# Patient Record
Sex: Male | Born: 1985 | Race: White | Hispanic: No | Marital: Single | Smoking: Former smoker
Health system: Southern US, Community
[De-identification: ages and names within clinical notes are randomized; demographics above are authoritative.]

## PROBLEM LIST (undated history)

## (undated) DIAGNOSIS — G40309 Generalized idiopathic epilepsy and epileptic syndromes, not intractable, without status epilepticus: Principal | ICD-10-CM

## (undated) DIAGNOSIS — Q282 Arteriovenous malformation of cerebral vessels: Secondary | ICD-10-CM

## (undated) HISTORY — DX: Arteriovenous malformation of cerebral vessels: Q28.2

## (undated) HISTORY — PX: MYRINGOTOMY WITH TUBE PLACEMENT: SHX5663

## (undated) HISTORY — DX: Generalized idiopathic epilepsy and epileptic syndromes, not intractable, without status epilepticus: G40.309

---
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
## 2013-05-24 ENCOUNTER — Other Ambulatory Visit (HOSPITAL_COMMUNITY): Payer: Self-pay | Admitting: Family Medicine

## 2013-05-24 ENCOUNTER — Ambulatory Visit (HOSPITAL_COMMUNITY)
Admission: RE | Admit: 2013-05-24 | Discharge: 2013-05-24 | Disposition: A | Discharge status: Home / Self Care | Payer: No Typology Code available for payment source | Source: Ambulatory Visit | Attending: Family Medicine | Admitting: Family Medicine

## 2013-05-24 DIAGNOSIS — R569 Unspecified convulsions: Secondary | ICD-10-CM

## 2013-05-24 DIAGNOSIS — R29818 Other symptoms and signs involving the nervous system: Secondary | ICD-10-CM | POA: Insufficient documentation

## 2013-05-25 ENCOUNTER — Encounter: Payer: Self-pay | Admitting: Neurology

## 2013-05-25 ENCOUNTER — Ambulatory Visit: Payer: No Typology Code available for payment source | Admitting: Diagnostic Neuroimaging

## 2013-05-25 ENCOUNTER — Ambulatory Visit (INDEPENDENT_AMBULATORY_CARE_PROVIDER_SITE_OTHER): Payer: No Typology Code available for payment source | Admitting: Neurology

## 2013-05-25 VITALS — BP 132/67 | HR 66 | Ht 71.75 in | Wt 190.0 lb

## 2013-05-25 DIAGNOSIS — Q283 Other malformations of cerebral vessels: Secondary | ICD-10-CM

## 2013-05-25 DIAGNOSIS — G40309 Generalized idiopathic epilepsy and epileptic syndromes, not intractable, without status epilepticus: Secondary | ICD-10-CM

## 2013-05-25 DIAGNOSIS — Q282 Arteriovenous malformation of cerebral vessels: Secondary | ICD-10-CM

## 2013-05-25 HISTORY — DX: Generalized idiopathic epilepsy and epileptic syndromes, not intractable, without status epilepticus: G40.309

## 2013-05-25 HISTORY — DX: Arteriovenous malformation of cerebral vessels: Q28.2

## 2013-05-25 MED ORDER — PHENYTOIN SODIUM EXTENDED 100 MG PO CAPS: 300.0000 mg | ORAL_CAPSULE | Freq: Every day | ORAL | Status: DC

## 2013-05-25 NOTE — Progress Notes (Signed)
Reason for visit: Seizures  Michael Rodriguez is a 27 y.o. male  History of present illness:  Michael Rodriguez is a 27 year old right-handed white male with a history of seizures dating back to 2012. The patient has had 4 total lifetime seizures, with the last one the day prior to this visit. The seizures comes out of sleep or just after awakening. The episodes are associated with generalized jerking and tongue biting. The patient does not lose control of the bowels or the bladder. The patient reports no focal numbness or weakness of the face, arms, or legs. The patient may have a headache following a seizure, but the patient otherwise does not have headaches. The patient denies any balance issues. 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 The patient is sent to this office for an evaluation.  Past Medical History  Diagnosis Date  . 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 AVM (arteriovenous malformation) brain 05/25/2013    Past Surgical History  Procedure Laterality Date  . Myringotomy with tube placement Bilateral     History reviewed. No pertinent family history.  Social history:  reports that he has been smoking Cigarettes.  He has been smoking about 0.00 packs per day. He does not have any smokeless tobacco history on file. He reports that  drinks alcohol. He reports that he does not use illicit drugs.  Medications:  No current outpatient prescriptions on file prior to visit.   No current facility-administered medications on file prior to visit.    Allergies: No Known Allergies  ROS:  Out of a complete 14 system review of symptoms, the patient complains only of the following symptoms, and all other reviewed systems are negative.  Snoring Memory loss, confusion, headache, seizure  Blood pressure 132/67, pulse 66, height 5' 11.75" (1.822 m), weight 190 lb (86.183  kg).  Physical Exam  General: The patient is alert and cooperative at the time of the examination.  Head: Pupils are equal, round, and reactive to light. Discs are flat bilaterally.  Neck: The neck is supple, no carotid bruits are noted.  Respiratory: The respiratory examination is clear.  Cardiovascular: The cardiovascular examination reveals a regular rate and rhythm, no obvious murmurs or rubs are noted.  Skin: Extremities are without significant edema.  Neurologic Exam  Mental status:  Cranial nerves: Facial symmetry is present. There is good sensation of the face to pinprick and soft touch bilaterally. The strength of the facial muscles and the muscles to head turning and shoulder shrug are normal bilaterally. Speech is well enunciated, no aphasia or dysarthria is noted. Extraocular movements are full. Visual fields are full.  Motor: The motor testing reveals 5 over 5 strength of all 4 extremities. Good symmetric motor tone is noted throughout.  Sensory: Sensory testing is intact to pinprick, soft touch, vibration sensation, and position sense on all 4 extremities. No evidence of extinction is noted.  Coordination: Cerebellar testing reveals good finger-nose-finger and heel-to-shin bilaterally.  Gait and station: Gait is normal. Tandem gait is normal. Romberg is negative. No drift is seen.  Reflexes: Deep tendon reflexes are symmetric and normal bilaterally. Toes are downgoing bilaterally.   Assessment/Plan:  1. Right frontal AVM  2. History seizures  The patient will be set up for MRI evaluation of the brain with and without gadolinium. I see no utility for an EEG study, as this will not  change medical management. The patient will likely require a cerebral angiogram, which will be set up after the MRI. The patient will be placed on Dilantin taking 300 mg at night. The patient will followup in 3 months. The patient is not to operate a motor vehicle for least 6  months.  Marlan Palau MD 05/25/2013 7:19 PM  [facility] [address] [place], [place] [address]  Phone [phone] Fax [phone]

## 2013-08-18 ENCOUNTER — Ambulatory Visit (INDEPENDENT_AMBULATORY_CARE_PROVIDER_SITE_OTHER): Payer: No Typology Code available for payment source

## 2013-08-18 DIAGNOSIS — G40309 Generalized idiopathic epilepsy and epileptic syndromes, not intractable, without status epilepticus: Secondary | ICD-10-CM

## 2013-08-18 DIAGNOSIS — Q282 Arteriovenous malformation of cerebral vessels: Secondary | ICD-10-CM

## 2013-08-18 DIAGNOSIS — Q283 Other malformations of cerebral vessels: Secondary | ICD-10-CM

## 2013-08-19 ENCOUNTER — Telehealth: Payer: Self-pay | Admitting: Neurology

## 2013-08-19 DIAGNOSIS — Q282 Arteriovenous malformation of cerebral vessels: Secondary | ICD-10-CM

## 2013-08-19 MED ORDER — GADOPENTETATE DIMEGLUMINE 469.01 MG/ML IV SOLN: 18.0000 mL | Freq: Once | INTRAVENOUS | Status: AC | PRN

## 2013-08-19 NOTE — Telephone Encounter (Signed)
I called the patient. The MRI the brain confirms a medium-sized arteriovenous malformation in the right frontal area. I'll get the patient set up for a cerebral angiogram at [place].

## 2013-08-25 ENCOUNTER — Telehealth: Payer: Self-pay | Admitting: Neurology

## 2013-08-26 NOTE — Telephone Encounter (Signed)
I called the patient and I talked with her mother. She had questions about the arteriovenous malformation. The patient will first go for a cerebral angiogram. He likely will require a neurosurgery evaluation, and he likely will require gamma knife treatments. I'll get the angiogram results first, and then consider a neurosurgery referral.

## 2013-08-29 ENCOUNTER — Telehealth (HOSPITAL_COMMUNITY): Payer: Self-pay | Admitting: Interventional Radiology

## 2013-08-29 NOTE — Telephone Encounter (Signed)
Called pt's mother again left VM for her to call to schedule consult JMichaux

## 2013-08-31 ENCOUNTER — Other Ambulatory Visit (HOSPITAL_COMMUNITY): Payer: Self-pay | Admitting: Chiropractic Medicine

## 2013-08-31 ENCOUNTER — Other Ambulatory Visit: Payer: Self-pay | Admitting: Neurology

## 2013-08-31 DIAGNOSIS — Q2730 Arteriovenous malformation, site unspecified: Secondary | ICD-10-CM

## 2013-09-01 ENCOUNTER — Ambulatory Visit (HOSPITAL_COMMUNITY)
Admission: RE | Admit: 2013-09-01 | Discharge: 2013-09-01 | Disposition: A | Discharge status: Home / Self Care | Payer: No Typology Code available for payment source | Source: Ambulatory Visit | Attending: Neurology | Admitting: Neurology

## 2013-09-01 ENCOUNTER — Other Ambulatory Visit (HOSPITAL_COMMUNITY): Payer: Self-pay | Admitting: Interventional Radiology

## 2013-09-01 ENCOUNTER — Encounter (HOSPITAL_COMMUNITY): Payer: Self-pay | Admitting: Pharmacy Technician

## 2013-09-01 DIAGNOSIS — Q2730 Arteriovenous malformation, site unspecified: Secondary | ICD-10-CM

## 2013-09-02 ENCOUNTER — Other Ambulatory Visit: Payer: Self-pay | Admitting: Radiology

## 2013-09-05 ENCOUNTER — Encounter (HOSPITAL_COMMUNITY): Payer: Self-pay

## 2013-09-05 ENCOUNTER — Other Ambulatory Visit (HOSPITAL_COMMUNITY): Payer: Self-pay | Admitting: Interventional Radiology

## 2013-09-05 ENCOUNTER — Ambulatory Visit (HOSPITAL_COMMUNITY)
Admission: RE | Admit: 2013-09-05 | Discharge: 2013-09-05 | Disposition: A | Discharge status: Home / Self Care | Payer: No Typology Code available for payment source | Source: Ambulatory Visit | Attending: Interventional Radiology | Admitting: Interventional Radiology

## 2013-09-05 DIAGNOSIS — G40309 Generalized idiopathic epilepsy and epileptic syndromes, not intractable, without status epilepticus: Secondary | ICD-10-CM | POA: Insufficient documentation

## 2013-09-05 DIAGNOSIS — Q283 Other malformations of cerebral vessels: Secondary | ICD-10-CM | POA: Diagnosis present

## 2013-09-05 DIAGNOSIS — Q2730 Arteriovenous malformation, site unspecified: Secondary | ICD-10-CM

## 2013-09-05 LAB — BASIC METABOLIC PANEL
BUN: 12 mg/dL (ref 6–23)
CO2: 23 mEq/L (ref 19–32)
Calcium: 9.4 mg/dL (ref 8.4–10.5)
Creatinine, Ser: 0.81 mg/dL (ref 0.50–1.35)
GFR calc Af Amer: >90 mL/min (ref >90)
GFR calc non Af Amer: >90 mL/min (ref >90)
Glucose, Bld: 97 mg/dL (ref 70–99)
Sodium: 138 mEq/L (ref 135–145)

## 2013-09-05 LAB — CBC
HCT: 42.8 % (ref 39.0–52.0)
Platelets: 245 10*3/uL (ref 150–400)
RBC: 4.63 MIL/uL (ref 4.22–5.81)
RDW: 13.3 % (ref 11.5–15.5)
WBC: 4 10*3/uL (ref 4.0–10.5)

## 2013-09-05 LAB — PROTIME-INR
INR: 0.96 (ref 0.00–1.49)
Prothrombin Time: 12.6 seconds (ref 11.6–15.2)

## 2013-09-05 LAB — APTT: aPTT: 26 seconds (ref 24–37)

## 2013-09-05 MED ORDER — MIDAZOLAM HCL 2 MG/2ML IJ SOLN
INTRAMUSCULAR | Status: AC
  Filled 2013-09-05: qty 4

## 2013-09-05 MED ORDER — FENTANYL CITRATE 0.05 MG/ML IJ SOLN
INTRAMUSCULAR | Status: AC
  Filled 2013-09-05: qty 2

## 2013-09-05 MED ORDER — IOHEXOL 300 MG/ML  SOLN
150.0000 mL | Freq: Once | INTRAMUSCULAR | Status: AC | PRN
  Administered 2013-09-05: 60 mL via INTRA_ARTERIAL

## 2013-09-05 MED ORDER — SODIUM CHLORIDE 0.9 % IV SOLN
INTRAVENOUS | Status: DC
  Administered 2013-09-05: 11:00:00 via INTRAVENOUS

## 2013-09-05 MED ORDER — HEPARIN SODIUM (PORCINE) 1000 UNIT/ML IJ SOLN
INTRAMUSCULAR | Status: AC | PRN
  Administered 2013-09-05: 1000 [IU] via INTRAVENOUS

## 2013-09-05 MED ORDER — SODIUM CHLORIDE 0.9 % IV SOLN: INTRAVENOUS | Status: AC

## 2013-09-05 MED ORDER — MIDAZOLAM HCL 2 MG/2ML IJ SOLN
INTRAMUSCULAR | Status: AC | PRN
  Administered 2013-09-05 (×2): 0.5 mg via INTRAVENOUS
  Administered 2013-09-05: 1 mg via INTRAVENOUS

## 2013-09-05 MED ORDER — FENTANYL CITRATE 0.05 MG/ML IJ SOLN
INTRAMUSCULAR | Status: AC | PRN
  Administered 2013-09-05 (×2): 12.5 ug via INTRAVENOUS
  Administered 2013-09-05: 25 ug via INTRAVENOUS

## 2013-09-05 NOTE — ED Notes (Signed)
Patient denies pain.

## 2013-09-05 NOTE — H&P (Signed)
Chief Complaint: "i'm here for an angiogram" HPI: Michael Rodriguez is an 27 y.o. male with recent seizures and found to have a large right frontal AV Malformation. He was referred to Dr. Corliss Skains and is now scheduled for diagnostic cerebral arteriogram. PMHx and meds reviewed. Pt feels well, no recent fevers, illness.  Past Medical History:  Past Medical History  Diagnosis Date  . 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 AVM (arteriovenous malformation) brain 05/25/2013    Past Surgical History:  Past Surgical History  Procedure Laterality Date  . Myringotomy with tube placement Bilateral     Family History: History reviewed. No pertinent family history.  Social History:  reports that he has been smoking Cigarettes.  He has been smoking about 0.00 packs per day. He does not have any smokeless tobacco history on file. He reports that  drinks alcohol. He reports that he does not use illicit drugs.  Allergies: No Known Allergies  Medications:   Medication List    ASK your doctor about these medications       gemfibrozil 600 MG tablet  Commonly known as:  LOPID  Take 600 mg by mouth every morning.     ibuprofen 200 MG tablet  Commonly known as:  ADVIL,MOTRIN  Take 400-600 mg by mouth every 6 (six) hours as needed for pain.     omega-3 acid ethyl esters 1 G capsule  Commonly known as:  LOVAZA  Take 1 g by mouth every morning.     phenytoin 100 MG ER capsule  Commonly known as:  DILANTIN  Take 3 capsules (300 mg total) by mouth at bedtime.        Please HPI for pertinent positives, otherwise complete 10 system ROS negative.  Physical Exam: BP 134/65  Pulse 55  Temp(Src) 98 F (36.7 C) (Oral)  Resp 20  Ht 6' (1.829 m)  Wt 190 lb (86.183 kg)  BMI 25.76 kg/m2  SpO2 100% Body mass index is 25.76 kg/(m^2).   General Appearance:  Alert, cooperative, no distress, appears stated age  Head:  Normocephalic, without obvious  abnormality, atraumatic  ENT: Unremarkable  Neck: Supple, symmetrical, trachea midline  Lungs:   Clear to auscultation bilaterally, no w/r/r, respirations unlabored without use of accessory muscles.  Chest Wall:  No tenderness or deformity  Heart:  Regular rate and rhythm, S1, S2 normal, no murmur, rub or gallop.  Abdomen:   Soft, non-tender, non distended.  Extremities: Extremities normal, atraumatic, no cyanosis or edema  Pulses: 2+ and symmetric femoral  Neurologic: Normal affect, no gross deficits.   No results found for this or any previous visit (from the past 48 hour(s)). No results found.  Assessment/Plan Right frontal AVM For cerebral angio today Discussed procedure, risks, complications, use of sedation. Labs pending Consent signed in chart  Brayton El PA-C 09/05/2013, 11:25 AM

## 2013-09-05 NOTE — Progress Notes (Signed)
UP AND WALKED AND TOL WELL; RIGHT GROIN STABLE NO BLEEDING OR HEMATOMA; VOIDED IN BATHROOM

## 2013-09-05 NOTE — Procedures (Addendum)
S/P 4 vessel cerebral arteriogram. RT CFA approach. Findings....  1.Large RT frontal AVM with Multiple arterial feeders from RT MCA ,RT ACA and RT PCA leptomingeal branches. 2.Single Venous cortical drainage into mir SSS. 3.Associated small aneurysms ,one on rt pericallosal,RT callosomarginal; ,and one in rhe ACOM region.. 4 Nidus size appro 4.2cm x 3.0 cm

## 2013-09-07 ENCOUNTER — Other Ambulatory Visit (HOSPITAL_COMMUNITY): Payer: Self-pay | Admitting: Interventional Radiology

## 2013-09-07 DIAGNOSIS — Q2730 Arteriovenous malformation, site unspecified: Secondary | ICD-10-CM

## 2013-09-15 ENCOUNTER — Ambulatory Visit (HOSPITAL_COMMUNITY)
Admission: RE | Admit: 2013-09-15 | Discharge: 2013-09-15 | Disposition: A | Discharge status: Home / Self Care | Payer: No Typology Code available for payment source | Source: Ambulatory Visit | Attending: Interventional Radiology | Admitting: Interventional Radiology

## 2013-09-15 DIAGNOSIS — Q2730 Arteriovenous malformation, site unspecified: Secondary | ICD-10-CM

## 2013-10-20 ENCOUNTER — Other Ambulatory Visit: Payer: Self-pay

## 2014-01-04 ENCOUNTER — Other Ambulatory Visit: Payer: Self-pay | Admitting: Neurology

## 2014-03-08 ENCOUNTER — Other Ambulatory Visit: Payer: Self-pay | Admitting: Neurology

## 2014-03-11 ENCOUNTER — Other Ambulatory Visit: Payer: Self-pay | Admitting: Neurology

## 2014-04-26 ENCOUNTER — Other Ambulatory Visit: Payer: Self-pay | Admitting: Neurology

## 2014-05-01 ENCOUNTER — Telehealth: Payer: Self-pay | Admitting: Neurology

## 2014-05-01 MED ORDER — PHENYTOIN SODIUM EXTENDED 100 MG PO CAPS: ORAL_CAPSULE | ORAL | Status: DC

## 2014-05-01 NOTE — Telephone Encounter (Signed)
Unfortunately, this med is not prescribed.  The pharmacy has a Rx on file, but claim it is too soon to refill.  I spoke with mom who says the patient is taking meds as prescribed.  She did indicate the bottle they gave the patient for this last fill was smaller than the previous bottles he's gotten.  This makes me think they may have mistakenly given him the incorrect amount of pills.  I called the pharmacy.  Spoke with Marcelino DusterMichelle.  Explained the situation and she placed me on hold for several minutes while they counted the meds they had on hand in comparison to what their system says they have on hand to see if they miscounted the drug.  He came back to the line and said they did shorten the patient by about 36 pills.  They will get this ready and call mom to advise when she can pick it up.

## 2014-05-01 NOTE — Telephone Encounter (Signed)
Patient has an appt scheduled 5/20 with Dr Anne HahnWillis.  Mom questioning if she could purchase maybe two days worth of med or if we have samples until he comes in.  He's been without meds for a few days and afraid he may have a seizure.  Please call and advise.

## 2014-05-01 NOTE — Telephone Encounter (Signed)
When I spoke with the pharmacy earlier they said they would call the patient.  I called the pharmacy.  They said the meds are ready for pick up.  I called mom back.  She is aware.

## 2014-05-01 NOTE — Telephone Encounter (Signed)
This Rx was already sent to the pharmacy on 05/14.  I called the pharmacy.  They said they did get the Rx, but it's refill too soon (per ins).  They will not pay for the meds until 5/25.  I called the patient back.  Spoke with dad.  He is aware.  They will follow up with the pharmacy.  Also said they would call back to schedule annual appt.

## 2014-05-01 NOTE — Telephone Encounter (Signed)
Patient's Dad calling for refill on Phenytoin 100mg  for patient--[facility] [address]--thank you.

## 2014-05-01 NOTE — Telephone Encounter (Signed)
Patient's mother calling to check on status of the previous message, regarding not having the correct amount of medication. Please return call and advise.

## 2014-05-03 ENCOUNTER — Encounter: Payer: Self-pay | Admitting: Neurology

## 2014-05-03 ENCOUNTER — Ambulatory Visit (INDEPENDENT_AMBULATORY_CARE_PROVIDER_SITE_OTHER): Payer: BC Managed Care – PPO | Admitting: Neurology

## 2014-05-03 VITALS — BP 127/62 | HR 67 | Wt 189.0 lb

## 2014-05-03 DIAGNOSIS — Q283 Other malformations of cerebral vessels: Secondary | ICD-10-CM

## 2014-05-03 DIAGNOSIS — Q282 Arteriovenous malformation of cerebral vessels: Secondary | ICD-10-CM

## 2014-05-03 DIAGNOSIS — G40309 Generalized idiopathic epilepsy and epileptic syndromes, not intractable, without status epilepticus: Secondary | ICD-10-CM

## 2014-05-03 NOTE — Patient Instructions (Signed)
Seizure, Adult A seizure means there is unusual activity in the brain. A seizure can cause changes in attention or behavior. Seizures often cause shaking (convulsions). Seizures often last from 30 seconds to 2 minutes. HOME CARE   If you are given medicines, take them exactly as told by your doctor.  Keep all doctor visits as told.  Do not swim or drive until your doctor says it is okay.  Teach others what to do if you have a seizure. They should:  Lay you on the ground.  Put a cushion under your head.  Loosen any tight clothing around your neck.  Turn you on your side.  Stay with you until you get better. GET HELP RIGHT AWAY IF:   The seizure lasts longer than 2 to 5 minutes.  The seizure is very bad.  The person does not wake up after the seizure.  The person's attention or behavior changes. Drive the person to the emergency room or call your local emergency services (911 in [place] MAKE SURE YOU:   Understand these instructions.  Will watch your condition.  Will get help right away if you are not doing well or get worse. Document Released: 05/19/2008 Document Revised: 02/23/2012 Document Reviewed: 11/19/2011 Exit[employer] Patient Information 2014 [employer].  

## 2014-05-03 NOTE — Progress Notes (Signed)
PATIENT: Michael RowanColin Hodder DOB: 01-09-1986  REASON FOR VISIT: follow up HISTORY FROM: patient  HISTORY OF PRESENT ILLNESS: [name] is a 28 year old right-handed white male with a history of seizures. He returns today for follow-up. The patient is currently on Dilantin taking 300 mg at night and tolerating it well. The patient has not had any seizures since the last visit.  The patient is operating a motor vehicle. At the last visit patient was sent for cerebral angiogram which confirmed a large right frontal AVM. The patient has spoken to three different specialist in regards to treatment for the AVM. They have had conflicting advice in regards to treatment. Two physicians recommended surgery first then radiation while another suggested radiation first. The patient has had no new medical issues since the last visit.   REVIEW OF SYSTEMS: Full 14 system review of systems performed and notable only for:  Constitutional: N/A  Eyes: N/A Ear/Nose/Throat: drooling Skin: N/A  Cardiovascular: N/A  Respiratory: N/A  Gastrointestinal: N/A  Genitourinary: N/A Hematology/Lymphatic: N/A  Endocrine: N/A Musculoskeletal: back pain Allergy/Immunology: N/A  Neurological: seizure Psychiatric: N/A Sleep: snoring   ALLERGIES: No Known Allergies  HOME MEDICATIONS: Outpatient Prescriptions Prior to Visit  Medication Sig Dispense Refill  . gemfibrozil (LOPID) 600 MG tablet Take 600 mg by mouth every morning.      Marland Kitchen. ibuprofen (ADVIL,MOTRIN) 200 MG tablet Take 400-600 mg by mouth every 6 (six) hours as needed for pain.      Marland Kitchen. omega-3 acid ethyl esters (LOVAZA) 1 G capsule Take 1 g by mouth every morning.       . phenytoin (DILANTIN) 100 MG ER capsule TAKE THREE CAPSULES BY MOUTH AT BEDTIME  90 capsule  0   No facility-administered medications prior to visit.    PAST MEDICAL HISTORY: Past Medical History  Diagnosis Date  . Generalized convulsive epilepsy without mention of intractable epilepsy  05/25/2013  . AVM (arteriovenous malformation) brain 05/25/2013    PAST SURGICAL HISTORY: Past Surgical History  Procedure Laterality Date  . Myringotomy with tube placement Bilateral     FAMILY HISTORY: Family History  Problem Relation Age of Onset  . Hypertension Father   . Heart attack Maternal Grandfather     SOCIAL HISTORY: History   Social History  . Marital Status: Single    Spouse Name: N/A    Number of Children: 0  . Years of Education: 13   Occupational History  . machinist    Social History Main Topics  . Smoking status: Former Smoker    Types: Cigarettes  . Smokeless tobacco: Current User    Types: Snuff  . Alcohol Use: Yes     Comment: Consumes alcohol twice per week  . Drug Use: No  . Sexual Activity: Not on file   Other Topics Concern  . Not on file   Social History Narrative  . No narrative on file      PHYSICAL EXAM  Filed Vitals:   05/03/14 1004  BP: 127/62  Pulse: 67  Weight: 189 lb (85.73 kg)   Body mass index is 25.63 kg/(m^2).  Generalized: Well developed, in no acute distress   Neurological examination  Mentation: Alert oriented to time, place, history taking. Follows all commands speech and language fluent Cranial nerve II-XII:  Extraocular movements were full, visual field were full on confrontational test. Motor: The motor testing reveals 5 over 5 strength of all 4 extremities. Good symmetric motor tone is noted throughout.  Sensory: Sensory testing  is intact to soft touch on all 4 extremities. No evidence of extinction is noted.  Coordination: Cerebellar testing reveals good finger-nose-finger and heel-to-shin bilaterally.  Gait and station: Gait is normal. Tandem gait is normal. Romberg is negative. No drift is seen.  Reflexes: Deep tendon reflexes are symmetric and normal bilaterally.    DIAGNOSTIC DATA (LABS, IMAGING, TESTING) - I reviewed patient records, labs, notes, testing and imaging myself where available.  Lab  Results  Component Value Date   WBC 4.0 09/05/2013   HGB 15.2 09/05/2013   HCT 42.8 09/05/2013   MCV 92.4 09/05/2013   PLT 245 09/05/2013      Component Value Date/Time   NA 138 09/05/2013 1056   K 4.1 09/05/2013 1056   CL 105 09/05/2013 1056   CO2 23 09/05/2013 1056   GLUCOSE 97 09/05/2013 1056   BUN 12 09/05/2013 1056   CREATININE 0.81 09/05/2013 1056   CALCIUM 9.4 09/05/2013 1056   GFRNONAA >90 09/05/2013 1056   GFRAA >90 09/05/2013 1056    ASSESSMENT AND PLAN 28 y.o. year old male  has a past medical history of Generalized convulsive epilepsy without mention of intractable epilepsy (05/25/2013) and AVM (arteriovenous malformation) brain (05/25/2013). here with:  1. Generalized convulsive epilepsy without mention of intractable epilepsy 2. AVM (arteriovenous malformation) brain  Patient has not had any seizures since the last visit. He is currently taking dilantin and is tolerating it well. We will continue Dilantin. He is currently weighing out his options in regards to treatment for the AVM. Patient plans to schedule an appointment with Dr. Corliss Skainseveshwar to discuss his options. Patient should follow up with us in 1 year or sooner if needed.    Butch PennyMegan Tarrin Menn, MSN, NP-C 05/03/2014, 10:12 AM [facility] [address], [address] [place], [place] [address] [phone]  Note: This document was prepared with digital dictation and possible smart phrase technology. Any transcriptional errors that result from this process are unintentional. York Spanielharles K Willis

## 2014-05-11 ENCOUNTER — Other Ambulatory Visit (HOSPITAL_COMMUNITY): Payer: Self-pay | Admitting: Interventional Radiology

## 2014-05-11 ENCOUNTER — Ambulatory Visit (HOSPITAL_COMMUNITY)
Admission: RE | Admit: 2014-05-11 | Discharge: 2014-05-11 | Disposition: A | Discharge status: Home / Self Care | Payer: BC Managed Care – PPO | Source: Ambulatory Visit | Attending: Interventional Radiology | Admitting: Interventional Radiology

## 2014-05-11 DIAGNOSIS — Q2730 Arteriovenous malformation, site unspecified: Secondary | ICD-10-CM

## 2014-07-14 ENCOUNTER — Other Ambulatory Visit: Payer: Self-pay | Admitting: Neurology

## 2014-07-14 NOTE — Telephone Encounter (Signed)
Patient requesting refill of Phenytoin, states he's almost out and [place], please call patient and advise.

## 2014-07-21 IMAGING — CT CT HEAD W/O CM
2 series · 15 of 30 positions shown, 19 images · non-contrast
Comparison: None.

CLINICAL DATA: 26-year-old male with recent seizure.  History of
one remote seizure years ago.  Focal neurologic deficits on the
left.

CT HEAD WITHOUT CONTRAST
TECHNIQUE: Contiguous axial images were obtained from the base of
the skull through the vertex without contrast.

[Series 2: head w/o · axial · non-contrast · 0.49mm/px · z∈[+59,+189]mm · 13 of 32 slices shown, 17 images]
[im 3/32  brain]
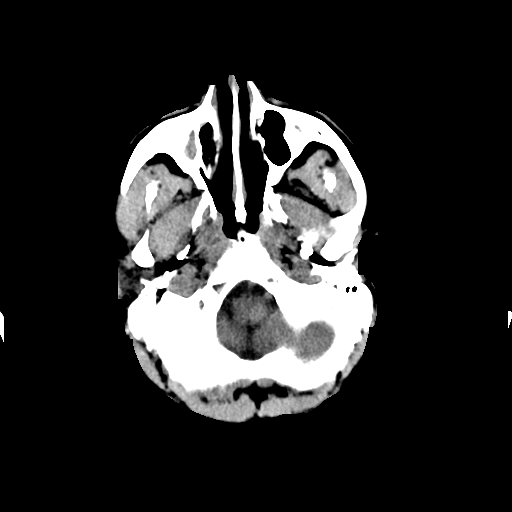
[im 3/32  bone]
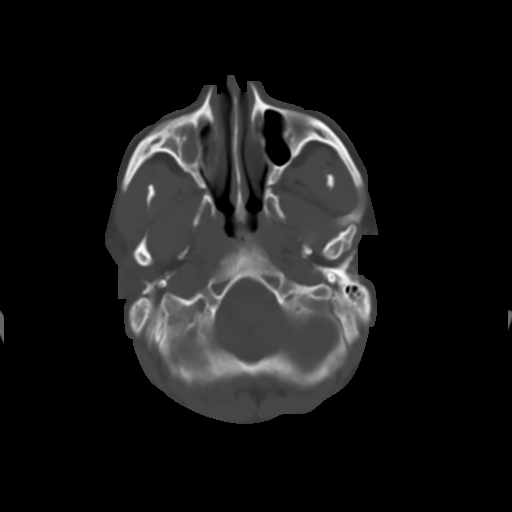
[im 5/32  brain]
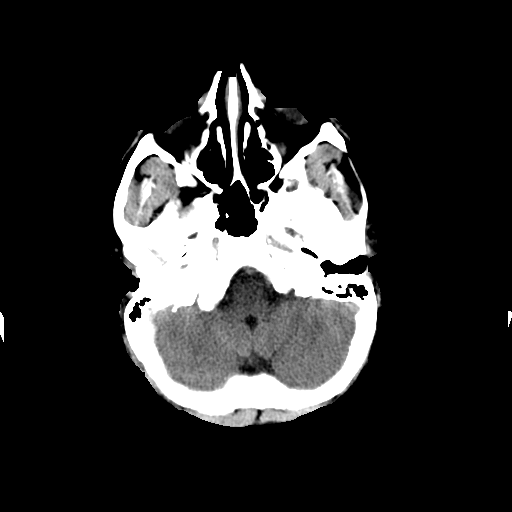
[im 7/32  brain]
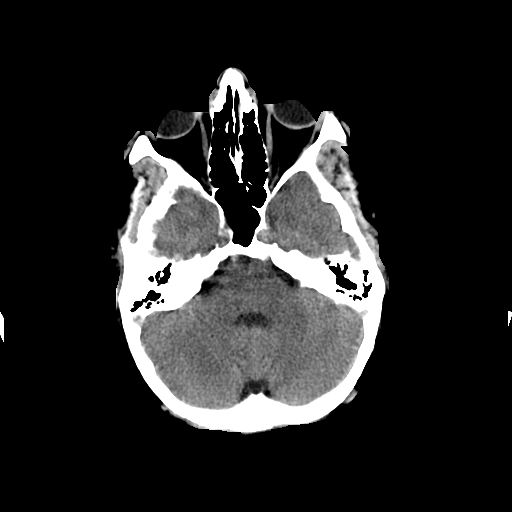
[im 9/32  brain]
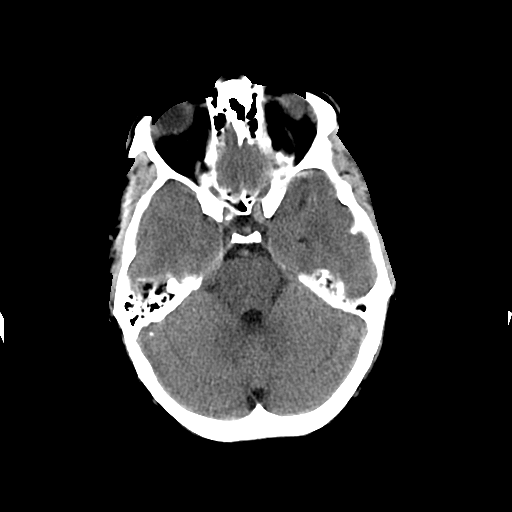
[im 12/32  brain]
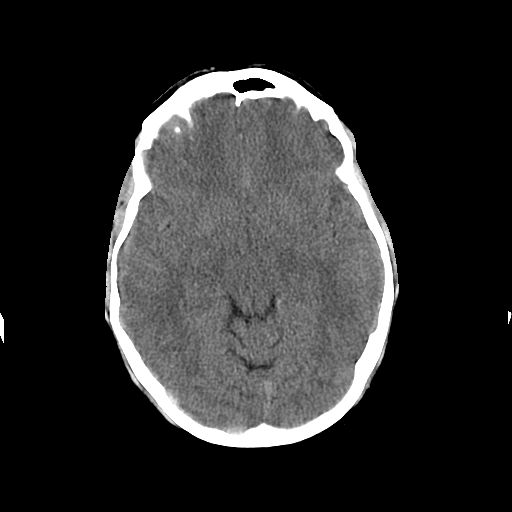
[im 12/32  bone]
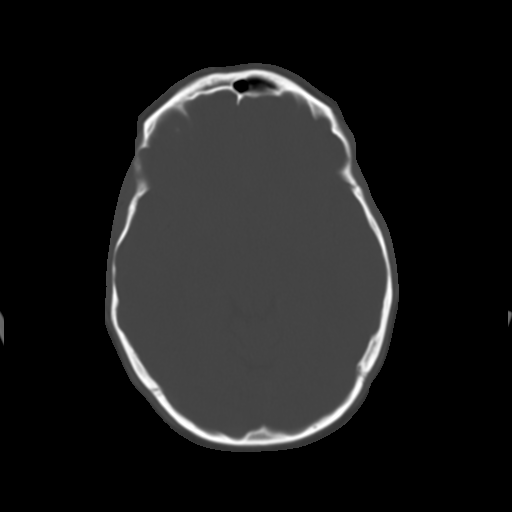
[im 14/32  brain]
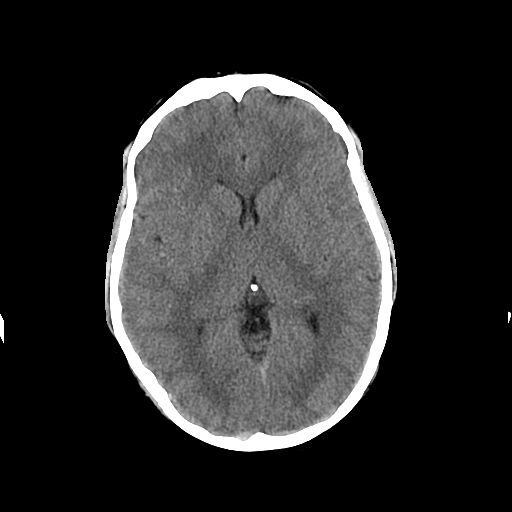
[im 16/32  brain]
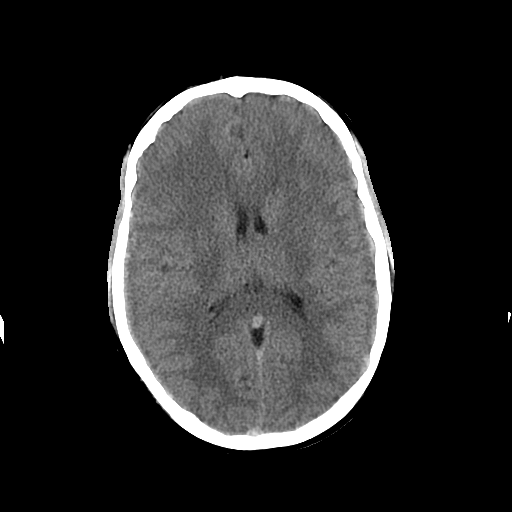
[im 18/32  brain]
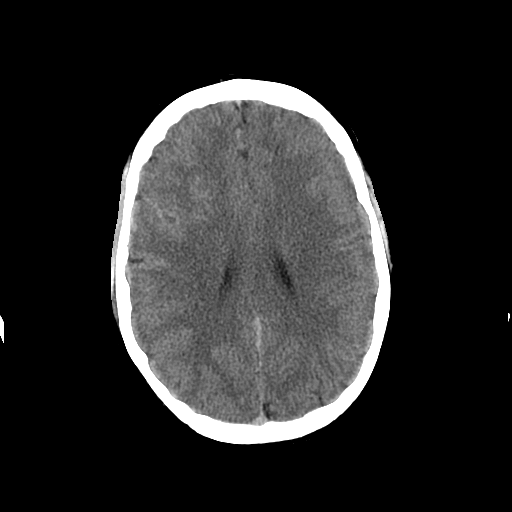
[im 20/32  brain]
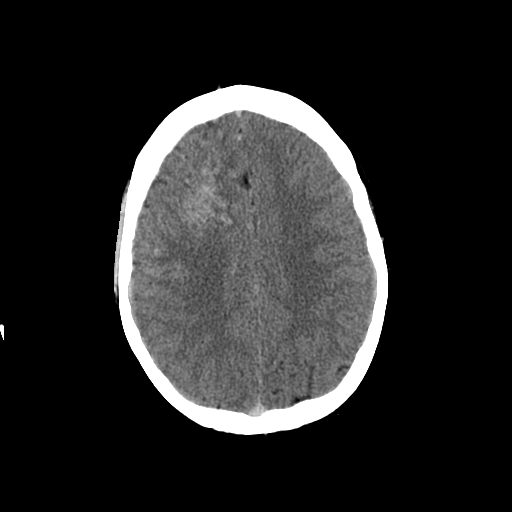
[im 20/32  bone]
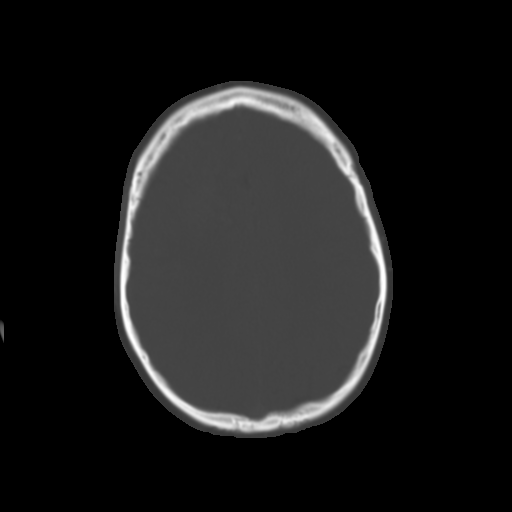
[im 23/32  brain]
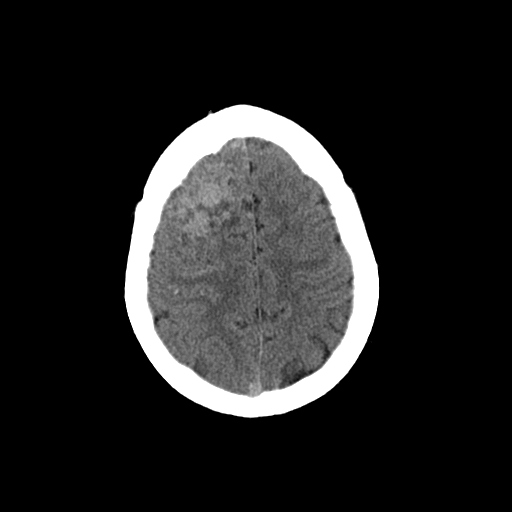
[im 25/32  brain]
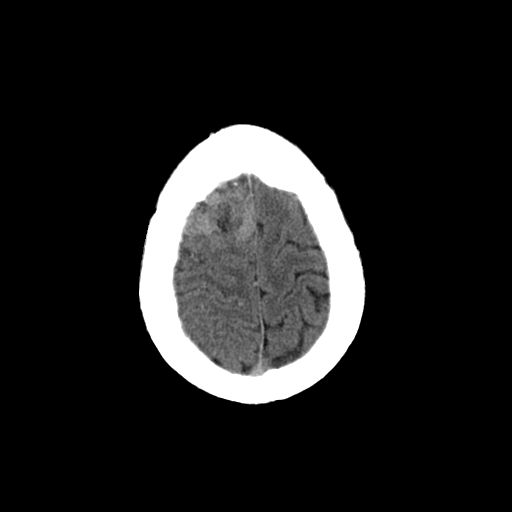
[im 27/32  brain]
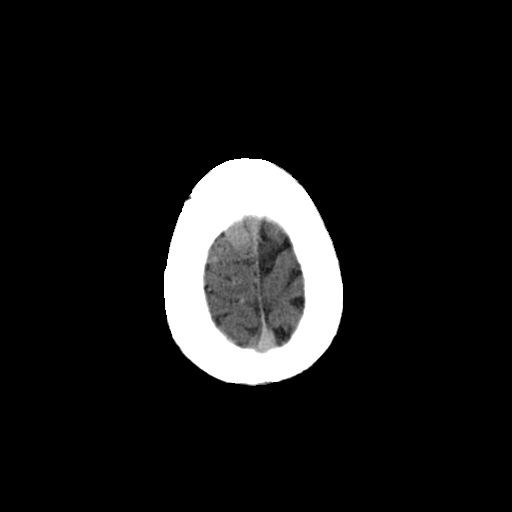
[im 29/32  brain]
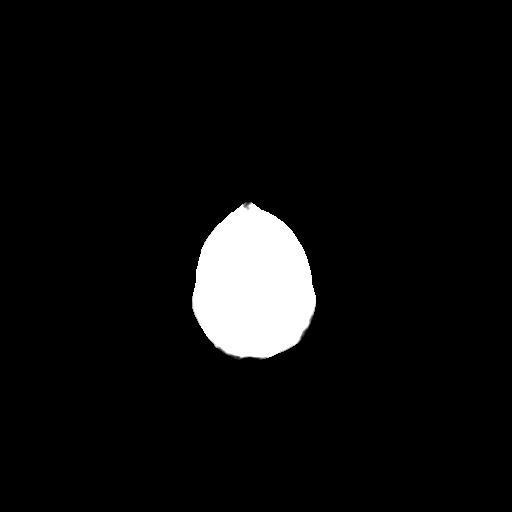
[im 29/32  bone]
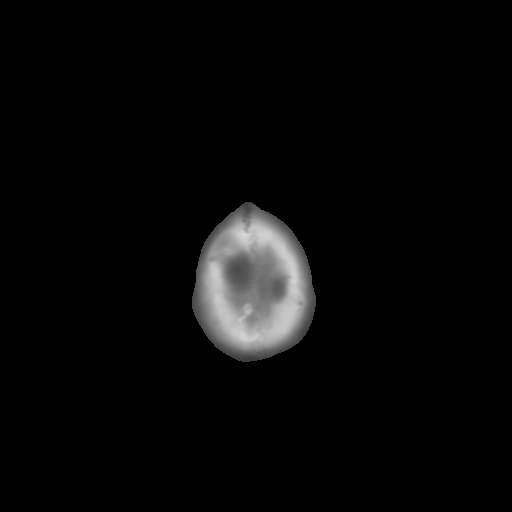

[Series 3: head w/o bone · axial · non-contrast · 0.49mm/px · z∈[+59,+79]mm · 2 of 32 slices shown]
[im 3/32  bone]
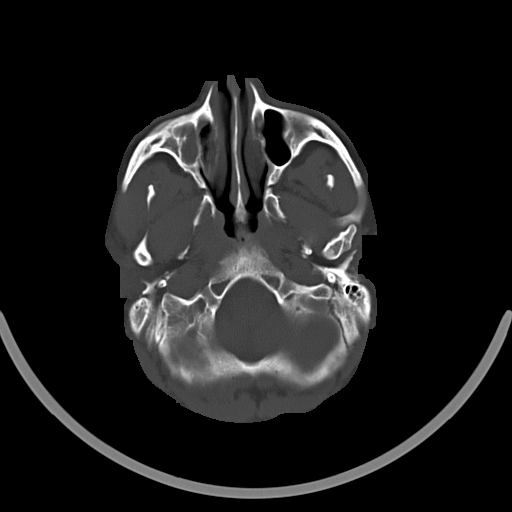
[im 7/32  bone]
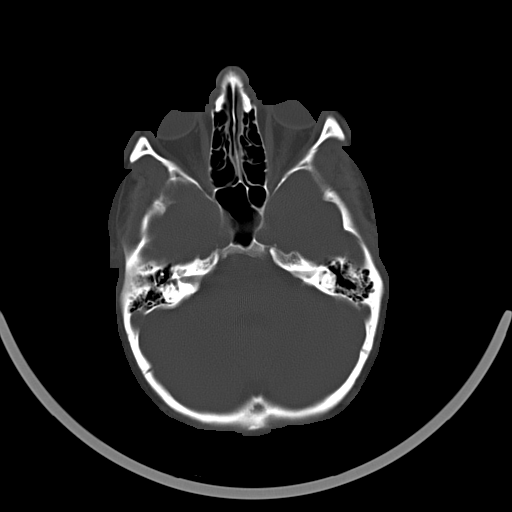

[15 of 30 positions shown; findings below may reference images not displayed]

FINDINGS: Right maxillary sinus is hypoplastic and opacified.
Other Visualized paranasal sinuses and mastoids are clear.  No
acute osseous abnormality identified.  Visualized orbits and scalp
soft tissues are within normal limits.

The heterogeneous and serpiginous increased density in the anterior
right frontal lobe, primarily the superior frontal gyrus, is
associated with evidence of increased subarachnoid vessels and
evidence of a large draining vein and into the sagittal sinus. By
noncontrast CT, the abnormal area encompasses 34 x 47 mm.

This is above the level of the lateral ventricles.  No mass effect
on the right frontal horn.  Subtle leftward midline shift along the
superior convexity.

No acute intracranial hemorrhage identified.  No ventriculomegaly.
Elsewhere there is normal gray-white matter differentiation. No
evidence of cortically based acute infarction identified.
IMPRESSION: 1.  Constellation of findings most compatible with a 4-5 cm
anterior/superior right frontal lobe arteriovenous malformation of
the brain.  Approximate Marcondes grade of 3 - 4 (pre
motor/supplemental motor area involvement suspected).
2.  No evidence of recent hemorrhage.
3.  Recommend follow-up brain MRI without and with contrast and
head MRA without contrast to fully characterize.

Study discussed by telephone with Dr. Moodeen Kesavan on 05/24/2013 at
6406 hours.

## 2014-08-08 ENCOUNTER — Telehealth: Payer: Self-pay | Admitting: Neurology

## 2014-08-08 DIAGNOSIS — Q282 Arteriovenous malformation of cerebral vessels: Secondary | ICD-10-CM

## 2014-08-08 NOTE — Telephone Encounter (Signed)
I called the patient, talked with the mother. The patient had surgery for removal of the AVM in August. The patient has some residual left arm clumsiness. The patient is ambulating well, no leg weakness. I will get an occupational therapy evaluation as an outpatient.

## 2014-08-08 NOTE — Telephone Encounter (Signed)
Spoke with patient's mother and she relayed the patient had surgery and discharged from [facility] around 07-25-14.  She was asking if we received any of his records from the AVM surgery and also she stated that she thought he was suppose to have PT, which he did receive in the hospital.  I relayed that I didn't see any records in the system she is going to try and get the records from them, especially Op note and D/C summary.  If she has trouble our medical records department will try to get them, but she made need to sign a release of information.  She explained there was a big disconnect on discharge, so she is trying to figure out the best process for his follow up.

## 2014-08-08 NOTE — Telephone Encounter (Signed)
Patient's mother requesting a call back from Cecil Cranker. [phone] regarding previous phone conversation. Please call.

## 2014-08-10 NOTE — Telephone Encounter (Signed)
[name] (pt care coordinator) in [facility] calling re: order for therapy for pt.  I told her Dr. Anne Hahn spoke to mother of pt and did order the OT for pt at the [facility]. [phone].  If anything else needed on there part to let her know [phone].  She will call family and the neuro rehab to see if anything else needed.   I then spoke to father who called right after.   Relayed this to him as well.  If questions concerns to let us know.   He will talk with Elease Hashimoto or [facility] in [place] about restrictions, limitations.   He verbalized understanding.

## 2014-08-11 ENCOUNTER — Ambulatory Visit: Payer: BC Managed Care – PPO | Attending: Neurology | Admitting: Occupational Therapy

## 2014-08-11 DIAGNOSIS — R279 Unspecified lack of coordination: Secondary | ICD-10-CM | POA: Diagnosis not present

## 2014-08-11 DIAGNOSIS — IMO0001 Reserved for inherently not codable concepts without codable children: Secondary | ICD-10-CM | POA: Diagnosis not present

## 2014-08-15 ENCOUNTER — Ambulatory Visit: Payer: BC Managed Care – PPO | Attending: Neurology | Admitting: Occupational Therapy

## 2014-08-15 DIAGNOSIS — R279 Unspecified lack of coordination: Secondary | ICD-10-CM | POA: Insufficient documentation

## 2014-08-15 DIAGNOSIS — IMO0001 Reserved for inherently not codable concepts without codable children: Secondary | ICD-10-CM | POA: Insufficient documentation

## 2014-08-15 DIAGNOSIS — Q283 Other malformations of cerebral vessels: Secondary | ICD-10-CM | POA: Insufficient documentation

## 2014-08-15 DIAGNOSIS — Z9889 Other specified postprocedural states: Secondary | ICD-10-CM | POA: Diagnosis not present

## 2014-08-17 ENCOUNTER — Ambulatory Visit: Payer: BC Managed Care – PPO | Admitting: Occupational Therapy

## 2014-08-17 ENCOUNTER — Telehealth: Payer: Self-pay | Admitting: Neurology

## 2014-08-17 DIAGNOSIS — IMO0001 Reserved for inherently not codable concepts without codable children: Secondary | ICD-10-CM | POA: Diagnosis not present

## 2014-08-17 NOTE — Telephone Encounter (Signed)
Faxed initial summary for occupational therapy services to [facility] on 08/17/14.

## 2014-08-22 ENCOUNTER — Ambulatory Visit: Payer: BC Managed Care – PPO | Admitting: Occupational Therapy

## 2014-08-22 DIAGNOSIS — IMO0001 Reserved for inherently not codable concepts without codable children: Secondary | ICD-10-CM | POA: Diagnosis not present

## 2014-08-24 ENCOUNTER — Ambulatory Visit: Payer: BC Managed Care – PPO | Admitting: Occupational Therapy

## 2014-08-24 DIAGNOSIS — IMO0001 Reserved for inherently not codable concepts without codable children: Secondary | ICD-10-CM | POA: Diagnosis not present

## 2014-08-29 ENCOUNTER — Ambulatory Visit: Payer: BC Managed Care – PPO | Admitting: Occupational Therapy

## 2014-08-29 DIAGNOSIS — IMO0001 Reserved for inherently not codable concepts without codable children: Secondary | ICD-10-CM | POA: Diagnosis not present

## 2014-09-01 ENCOUNTER — Ambulatory Visit: Payer: BC Managed Care – PPO | Admitting: Occupational Therapy

## 2014-09-01 DIAGNOSIS — IMO0001 Reserved for inherently not codable concepts without codable children: Secondary | ICD-10-CM | POA: Diagnosis not present

## 2014-09-05 ENCOUNTER — Encounter: Payer: BC Managed Care – PPO | Admitting: Occupational Therapy

## 2014-09-07 ENCOUNTER — Encounter: Payer: BC Managed Care – PPO | Admitting: Occupational Therapy

## 2015-02-21 ENCOUNTER — Ambulatory Visit (INDEPENDENT_AMBULATORY_CARE_PROVIDER_SITE_OTHER): Payer: No Typology Code available for payment source | Admitting: Neurology

## 2015-02-21 ENCOUNTER — Encounter: Payer: Self-pay | Admitting: Neurology

## 2015-02-21 VITALS — BP 117/64 | HR 59 | Ht 73.0 in | Wt 186.4 lb

## 2015-02-21 DIAGNOSIS — G40309 Generalized idiopathic epilepsy and epileptic syndromes, not intractable, without status epilepticus: Secondary | ICD-10-CM

## 2015-02-21 DIAGNOSIS — Q283 Other malformations of cerebral vessels: Secondary | ICD-10-CM | POA: Diagnosis not present

## 2015-02-21 DIAGNOSIS — Q282 Arteriovenous malformation of cerebral vessels: Secondary | ICD-10-CM

## 2015-02-21 NOTE — Progress Notes (Signed)
    Reason for visit: Seizures  Michael RowanColin Rodriguez is an 29 y.o. male  History of present illness:  [name] is a 29 year old right-handed white gentleman with a history of a large right frontal artery venous malformation. The patient has had some seizures related to this. He was on Dilantin. In the summer 2015, the patient had [place] of [place]. He did not require any radiation therapy. The patient had a transient residual left hemiparesis, mainly affecting the left arm. The patient has had good recovery following the surgery. He indicates that he continued on Dilantin for several months, but he began forgetting to take his medications. The patient has been off of all anticonvulsant therapy for 3-4 months at this point. He has not had any recurrent seizures. He indicates that the left hemiparesis has essentially resolved at this point. He initially did require some occupational therapy. He returns for an evaluation. He continues to operate a motor vehicle at this time.  Past Medical History  Diagnosis Date  . 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 AVM (arteriovenous malformation) brain 05/25/2013    Past Surgical History  Procedure Laterality Date  . Myringotomy with tube placement Bilateral     Family History  Problem Relation Age of Onset  . Hypertension Father   . Heart attack Maternal Grandfather     Social history:  reports that he has quit smoking. His smoking use included Cigarettes. His smokeless tobacco use includes Snuff. He reports that he drinks alcohol. He reports that he does not use illicit drugs.   No Known Allergies  Medications:  Prior to Admission medications   Not on File    ROS:  Out of a complete 14 system review of symptoms, the patient complains only of the following symptoms, and all other reviewed systems are negative.  History of AVM, right frontal History of seizures  Blood  pressure 117/64, pulse 59, height 6\' 1"  (1.854 m), weight 186 lb 6.4 oz (84.55 kg).  Physical Exam  General: The patient is alert and cooperative at the time of the examination.  Skin: No significant peripheral edema is noted.   Neurologic Exam  Mental status: The patient is oriented x 3.  Cranial nerves: Facial symmetry is present. Speech is normal, no aphasia or dysarthria is noted. Extraocular movements are full. Visual fields are full.  Motor: The patient has good strength in all 4 extremities.  Sensory examination: Soft touch sensation is symmetric on the face, arms, and legs.  Coordination: The patient has good finger-nose-finger and heel-to-shin bilaterally.  Gait and station: The patient has a normal gait. Tandem gait is normal. Romberg is negative. No drift is seen.  Reflexes: Deep tendon reflexes are symmetric.   Assessment/Plan:  1. Right frontal arteriovenous malformation, status post resection  2. Transient left hemiparesis following surgery  3. History of seizures  The patient has done very well postoperatively. The patient was told that the arteriovenous malformation was fully resected. He has come off of all his anticonvulsant medications, and he is doing well at this point. The patient will therefore follow-up through this office on an as-needed basis, he is to contact our office if any recurrence of seizure events is noted. Fortunately, he did not require radiation therapy.   Marlan Palau. Keith Tequilla Cousineau MD 02/21/2015 8:10 AM  [facility] [address] [place], [place] [id number]  Phone [phone] Fax [phone]

## 2015-02-21 NOTE — Patient Instructions (Signed)
°  Arteriovenous Malformation °An arteriovenous malformation (AVM) is a disorder that has been present since birth (congenital). It is characterized by a complex, tangled web of arteries and veins. An AVM may occur in the brain, brainstem, or spinal cord. °CAUSES  °It is caused by abnormal development of blood vessels. °SYMPTOMS  °The most common problems (symptoms) of AVM include: °· Bleeding (hemorrhaging). °· Convulsions (seizures). °· Headaches. °· Neurological problems, such as: °¨ Paralysis. °¨ Loss of speech, memory, or vision. °TREATMENT  °There are three general forms of treatment for AVM: °· Surgery. °· Embolization. This involves closing off the vessels of the AVM by injecting glue into them. Embolization is often used before surgery. °· Radiosurgery. This involves focusing radiation on the AVM. °AVMs that hemorrhage can lead to serious neurological problems, and sometimes death. But some people have AVMs that never cause problems. °Document Released: 11/21/2002 Document Revised: 02/23/2012 Document Reviewed: 07/06/2008 °[employer]® Patient Information ©2015 [employer]. This information is not intended to replace advice given to you by your health care provider. Make sure you discuss any questions you have with your health care provider. ° °

## 2015-05-04 ENCOUNTER — Ambulatory Visit: Payer: BC Managed Care – PPO | Admitting: Neurology
